# Patient Record
Sex: Female | Born: 2000 | Race: White | Hispanic: No | Marital: Single | Smoking: Never smoker
Health system: Southern US, Community
[De-identification: ages and names within clinical notes are randomized; demographics above are authoritative.]

## PROBLEM LIST (undated history)

## (undated) DIAGNOSIS — F419 Anxiety disorder, unspecified: Secondary | ICD-10-CM

## (undated) DIAGNOSIS — F32A Depression, unspecified: Secondary | ICD-10-CM

## (undated) DIAGNOSIS — F329 Major depressive disorder, single episode, unspecified: Secondary | ICD-10-CM

---
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
## 2020-02-28 ENCOUNTER — Encounter: Payer: Self-pay | Admitting: Nurse Practitioner

## 2020-02-28 ENCOUNTER — Other Ambulatory Visit: Payer: Self-pay

## 2020-02-28 ENCOUNTER — Ambulatory Visit (INDEPENDENT_AMBULATORY_CARE_PROVIDER_SITE_OTHER): Payer: Medicaid Other | Admitting: Nurse Practitioner

## 2020-02-28 VITALS — BP 110/68 | HR 77 | Temp 97.9°F | Resp 20 | Ht 62.0 in | Wt 213.0 lb

## 2020-02-28 DIAGNOSIS — R234 Changes in skin texture: Secondary | ICD-10-CM

## 2020-02-28 MED ORDER — CLOTRIMAZOLE-BETAMETHASONE 1-0.05 % EX CREA: 1.0000 "application " | TOPICAL_CREAM | Freq: Two times a day (BID) | CUTANEOUS | 0 refills | Status: DC

## 2020-02-28 NOTE — Progress Notes (Signed)
° °  Subjective:    Patient ID: Lindsay Valdez, female    DOB: 12-14-2000, 19 y.o.   MRN: [id number]   Chief Complaint: Discolored area to left breast (For 2 months)   HPI Patient come sin today c/o discoloration of area on left breast. Has been that way for several months. But now area feels raised and slightly sensative to touch. No nipple discharge.  denies family history of breast cancer.   Review of Systems  Constitutional: Negative for diaphoresis.  Eyes: Negative for pain.  Respiratory: Negative for shortness of breath.   Cardiovascular: Negative for chest pain, palpitations and leg swelling.  Gastrointestinal: Negative for abdominal pain.  Endocrine: Negative for polydipsia.  Skin: Negative for rash.  Neurological: Negative for dizziness, weakness and headaches.  Hematological: Does not bruise/bleed easily.  All other systems reviewed and are negative.      Objective:   Physical Exam Vitals and nursing note reviewed.  Constitutional:      Appearance: She is obese.  Cardiovascular:     Rate and Rhythm: Normal rate and regular rhythm.     Heart sounds: Normal heart sounds.  Pulmonary:     Breath sounds: Normal breath sounds.  Chest:     Breasts:        Right: Normal. No inverted nipple, mass or nipple discharge.        Left: Skin change present. No mass or nipple discharge.       Comments: White patchy slightly raised are on left breast Skin:    General: Skin is warm and dry.  Neurological:     General: No focal deficit present.     Mental Status: She is alert.  Psychiatric:        Mood and Affect: Mood normal.        Behavior: Behavior normal.    BP 110/68    Pulse 77    Temp 97.9 F (36.6 C) (Temporal)    Resp 20    Ht 5\' 2"  (1.575 m)    Wt 213 lb (96.6 kg)    SpO2 98%    BMI 38.96 kg/m        Assessment & Plan:  Lindsay Valdez in today with chief complaint of Discolored area to left breast (For 2 months)   1. Breast skin changes Meds ordered  this encounter  Medications   clotrimazole-betamethasone (LOTRISONE) cream    Sig: Apply 1 application topically 2 (two) times daily.    Dispense:  30 g    Refill:  0    Order Specific Question:   Supervising Provider    Answer:   A [[id number]]   If area does not improve will do U?S first If U/S is ok then will do derm referral Continue to watch area and report if no change    The above assessment and management plan was discussed with the patient. The patient verbalized understanding of and has agreed to the management plan. Patient is aware to call the clinic if symptoms persist or worsen. Patient is aware when to return to the clinic for a follow-up visit. Patient educated on when it is appropriate to go to the emergency department.   Mary-Margaret Arville Care, FNP

## 2020-06-27 DIAGNOSIS — Z20822 Contact with and (suspected) exposure to covid-19: Secondary | ICD-10-CM | POA: Diagnosis not present

## 2020-06-30 DIAGNOSIS — Z20822 Contact with and (suspected) exposure to covid-19: Secondary | ICD-10-CM | POA: Diagnosis not present

## 2020-09-07 ENCOUNTER — Other Ambulatory Visit: Payer: Self-pay

## 2020-09-07 ENCOUNTER — Encounter: Payer: Self-pay | Admitting: Family Medicine

## 2020-09-07 ENCOUNTER — Ambulatory Visit (INDEPENDENT_AMBULATORY_CARE_PROVIDER_SITE_OTHER): Payer: Medicaid Other | Admitting: Family Medicine

## 2020-09-07 VITALS — BP 109/74 | HR 78 | Temp 97.7°F | Wt 227.0 lb

## 2020-09-07 DIAGNOSIS — R234 Changes in skin texture: Secondary | ICD-10-CM

## 2020-09-07 NOTE — Patient Instructions (Signed)
Ultrasound ordered in anticipation that the dermatologist will want it.  I have placed referral to derm in .  Contact Toni Amend here at [employer] if you do not hear from either ultrasound or dermatology in 1 week.

## 2020-09-07 NOTE — Progress Notes (Signed)
   Subjective: CC: Skin lesions PCP: Raliegh Ip, DO KGU:RKYHCWC Lindsay Valdez is a 20 y.o. female presenting to clinic today for:  1.  Skin lesions Patient reports of regular history of depigmentation along the left breast.  However, she notes that these areas have been getting larger despite recent treatment with clotrimazole-betamethasone cream, which she used twice daily for 2 weeks.  She does describe the lesions as itchy but that it burns and is overly sensitive if she scratches it.  No nipple discharge, palpable lumps in the breast.  There is a family history of skin cancer, which is what she was afraid of.   ROS: Per HPI  No Known Allergies Past Medical History:  Diagnosis Date  . Anxiety   . Depression     Current Outpatient Medications:  .  clotrimazole-betamethasone (LOTRISONE) cream, Apply 1 application topically 2 (two) times daily., Disp: 30 g, Rfl: 0 Social History   Socioeconomic History  . Marital status: Single    Spouse name: Not on file  . Number of children: Not on file  . Years of education: Not on file  . Highest education level: Not on file  Occupational History  . Not on file  Tobacco Use  . Smoking status: Passive Smoke Exposure - Never Smoker  . Smokeless tobacco: Never Used  Vaping Use  . Vaping Use: Never used  Substance and Sexual Activity  . Alcohol use: No  . Drug use: No  . Sexual activity: Never  Other Topics Concern  . Not on file  Social History Narrative  . Not on file   Social Determinants of Health   Financial Resource Strain: Not on file  Food Insecurity: Not on file  Transportation Needs: Not on file  Physical Activity: Not on file  Stress: Not on file  Social Connections: Not on file  Intimate Partner Violence: Not on file   Family History  Problem Relation Age of Onset  . Hypertension Mother   . Hyperthyroidism Mother   . Diabetes Paternal Grandmother   . COPD Paternal Grandmother   . Hyperlipidemia Paternal  Grandmother   . Hypertension Paternal Grandmother   . Anxiety disorder Paternal Grandmother     Objective: Office vital signs reviewed. BP 109/74   Pulse 78   Temp 97.7 F (36.5 C)   Wt 227 lb (103 kg)   SpO2 98%   BMI 41.52 kg/m   Physical Examination:  General: Awake, alert, well nourished, No acute distress Breast: Irregularly shaped patches of mildly thickened skin with depigmentation.  No rolled borders, associated erythema.  No palpable breast lumps or nodules.  No skin breakdown otherwise.  Assessment/ Plan: 20 y.o. female   Breast skin changes - Plan: US BREAST COMPLETE UNI LEFT INC AXILLA, Ambulatory referral to Dermatology  Uncertain etiology of the skin changes.  At first I also believe this to be fungal in nature but she is status post treatment with clotrimazole-betamethasone cream and this is not helped at all.  Given longevity of symptoms I have recommended that she see a dermatologist for further evaluation.  We will also plan for breast ultrasound as well.  No orders of the defined types were placed in this encounter.  No orders of the defined types were placed in this encounter.    Raliegh Ip, DO [facility] [phone]

## 2020-09-10 ENCOUNTER — Telehealth: Payer: Self-pay | Admitting: Family Medicine

## 2020-09-18 ENCOUNTER — Ambulatory Visit (HOSPITAL_COMMUNITY)
Admission: RE | Admit: 2020-09-18 | Discharge: 2020-09-18 | Disposition: A | Discharge status: Home / Self Care | Payer: Medicaid Other | Source: Ambulatory Visit | Attending: Family Medicine | Admitting: Family Medicine

## 2020-09-18 ENCOUNTER — Other Ambulatory Visit: Payer: Self-pay

## 2020-09-18 DIAGNOSIS — R234 Changes in skin texture: Secondary | ICD-10-CM | POA: Insufficient documentation

## 2020-09-18 DIAGNOSIS — N6489 Other specified disorders of breast: Secondary | ICD-10-CM | POA: Diagnosis not present

## 2020-09-19 DIAGNOSIS — D485 Neoplasm of uncertain behavior of skin: Secondary | ICD-10-CM | POA: Diagnosis not present

## 2020-09-19 DIAGNOSIS — L940 Localized scleroderma [morphea]: Secondary | ICD-10-CM | POA: Diagnosis not present

## 2020-09-19 NOTE — Progress Notes (Signed)
Patient aware and verbalized understanding. °

## 2020-10-01 DIAGNOSIS — L900 Lichen sclerosus et atrophicus: Secondary | ICD-10-CM | POA: Diagnosis not present

## 2020-10-05 ENCOUNTER — Ambulatory Visit: Payer: Medicaid Other | Admitting: Nurse Practitioner

## 2020-10-09 ENCOUNTER — Encounter: Payer: Self-pay | Admitting: Family Medicine

## 2021-03-15 ENCOUNTER — Encounter: Payer: Self-pay | Admitting: Family Medicine

## 2021-03-15 ENCOUNTER — Telehealth (INDEPENDENT_AMBULATORY_CARE_PROVIDER_SITE_OTHER): Payer: Medicaid Other | Admitting: Family Medicine

## 2021-03-15 DIAGNOSIS — R519 Headache, unspecified: Secondary | ICD-10-CM

## 2021-03-15 DIAGNOSIS — Z20822 Contact with and (suspected) exposure to covid-19: Secondary | ICD-10-CM

## 2021-03-15 DIAGNOSIS — R059 Cough, unspecified: Secondary | ICD-10-CM

## 2021-03-15 NOTE — Progress Notes (Signed)
Virtual Visit via MyChart Video Note Due to COVID-19 pandemic this visit was conducted virtually. This visit type was conducted due to national recommendations for restrictions regarding the COVID-19 Pandemic (e.g. social distancing, sheltering in place) in an effort to limit this patient's exposure and mitigate transmission in our community. All issues noted in this document were discussed and addressed.  A physical exam was not performed with this format.   I connected with Lindsay Valdez on 03/15/2021 at 0800 by MyChart Video and verified that I am speaking with the correct person using two identifiers. Lindsay Valdez is currently located at home and family is currently with them during visit. The provider, Kari Baars, FNP is located in their office at time of visit.  I discussed the limitations, risks, security and privacy concerns of performing an evaluation and management service by telephone and the availability of in person appointments. I also discussed with the patient that there may be a patient responsible charge related to this service. The patient expressed understanding and agreed to proceed.  Subjective:  Patient ID: Lindsay Valdez, female    DOB: 08-Mar-2001, 20 y.o.   MRN: [id number]  Chief Complaint:  Covid Exposure   HPI: Lindsay Valdez is a 20 y.o. female presenting on 03/15/2021 for Covid Exposure   Pt states she was exposed to several coworkers who are positive for COVID-19. States she started having a headache, sore throat, congestion, and nausea 2 days ago. Has had negative home test but employer would like her to have a send off test to confirm. She has been taking tylenol with relief of symptoms.     Relevant past medical, surgical, family, and social history reviewed and updated as indicated.  Allergies and medications reviewed and updated.   Past Medical History:  Diagnosis Date   Anxiety    Depression     History reviewed. No pertinent surgical  history.  Social History   Socioeconomic History   Marital status: Single    Spouse name: Not on file   Number of children: Not on file   Years of education: Not on file   Highest education level: Not on file  Occupational History   Not on file  Tobacco Use   Smoking status: Passive Smoke Exposure - Never Smoker   Smokeless tobacco: Never  Vaping Use   Vaping Use: Never used  Substance and Sexual Activity   Alcohol use: No   Drug use: No   Sexual activity: Never  Other Topics Concern   Not on file  Social History Narrative   Not on file   Social Determinants of Health   Financial Resource Strain: Not on file  Food Insecurity: Not on file  Transportation Needs: Not on file  Physical Activity: Not on file  Stress: Not on file  Social Connections: Not on file  Intimate Partner Violence: Not on file    Outpatient Encounter Medications as of 03/15/2021  Medication Sig   clotrimazole-betamethasone (LOTRISONE) cream Apply 1 application topically 2 (two) times daily.   No facility-administered encounter medications on file as of 03/15/2021.    No Known Allergies  Review of Systems  Constitutional:  Positive for fatigue. Negative for activity change, appetite change, chills, diaphoresis, fever and unexpected weight change.  HENT:  Positive for congestion and sore throat.   Eyes: Negative.   Respiratory:  Negative for cough, choking, chest tightness, shortness of breath, wheezing and stridor.   Cardiovascular:  Negative for chest pain, palpitations and leg swelling.  Gastrointestinal:  Positive for nausea. Negative for abdominal distention, abdominal pain, anal bleeding, blood in stool, constipation, diarrhea, rectal pain and vomiting.  Endocrine: Negative.   Genitourinary:  Negative for decreased urine volume, difficulty urinating, dysuria, frequency and urgency.  Musculoskeletal:  Negative for arthralgias and myalgias.  Skin: Negative.   Allergic/Immunologic: Negative.    Neurological:  Positive for headaches. Negative for dizziness, tremors, seizures, syncope, facial asymmetry, speech difficulty, weakness, light-headedness and numbness.  Hematological: Negative.   Psychiatric/Behavioral:  Negative for confusion, hallucinations, sleep disturbance and suicidal ideas.   All other systems reviewed and are negative.       Observations/Objective: No vital signs or physical exam, this was a telephone or virtual health encounter.  Pt alert and oriented, answers all questions appropriately, and able to speak in full sentences.    Assessment and Plan: Lindsay Valdez was seen today for covid exposure.  Diagnoses and all orders for this visit:  Close exposure to COVID-19 virus Cough Aching headache Recent exposure to COVID-19. Symptoms for 2 days. Negative home test, will obtain send off testing. Symptomatic care discussed in detail. Report any new, worsening, or persistent symptoms.  -     Novel Coronavirus, NAA ([facility])    Follow Up Instructions: Return if symptoms worsen or fail to improve.    I discussed the assessment and treatment plan with the patient. The patient was provided an opportunity to ask questions and all were answered. The patient agreed with the plan and demonstrated an understanding of the instructions.   The patient was advised to call back or seek an in-person evaluation if the symptoms worsen or if the condition fails to improve as anticipated.  The above assessment and management plan was discussed with the patient. The patient verbalized understanding of and has agreed to the management plan. Patient is aware to call the clinic if they develop any new symptoms or if symptoms persist or worsen. Patient is aware when to return to the clinic for a follow-up visit. Patient educated on when it is appropriate to go to the emergency department.    I provided 15 minutes of MyChart -face-to-face time during this encounter. The video started at  0800. The video ended at [id number]. The other time was used for coordination of care.    Kari Baars, FNP-C [facility] [address] [place], [place] [address] [phone] 03/15/2021

## 2021-03-16 LAB — NOVEL CORONAVIRUS, NAA: SARS-CoV-2, NAA: NOT DETECTED

## 2021-03-16 LAB — SARS-COV-2, NAA 2 DAY TAT

## 2021-03-18 DIAGNOSIS — H5213 Myopia, bilateral: Secondary | ICD-10-CM | POA: Diagnosis not present

## 2021-04-30 ENCOUNTER — Encounter: Payer: Self-pay | Admitting: Nurse Practitioner

## 2021-04-30 ENCOUNTER — Ambulatory Visit (HOSPITAL_COMMUNITY)
Admission: RE | Admit: 2021-04-30 | Discharge: 2021-04-30 | Disposition: A | Discharge status: Home / Self Care | Payer: Medicaid Other | Source: Ambulatory Visit | Attending: Nurse Practitioner | Admitting: Nurse Practitioner

## 2021-04-30 ENCOUNTER — Ambulatory Visit (INDEPENDENT_AMBULATORY_CARE_PROVIDER_SITE_OTHER): Payer: Medicaid Other | Admitting: Nurse Practitioner

## 2021-04-30 ENCOUNTER — Other Ambulatory Visit: Payer: Self-pay

## 2021-04-30 VITALS — BP 124/73 | HR 97 | Temp 98.1°F | Ht 62.0 in | Wt 235.0 lb

## 2021-04-30 DIAGNOSIS — S0990XA Unspecified injury of head, initial encounter: Secondary | ICD-10-CM | POA: Diagnosis not present

## 2021-04-30 DIAGNOSIS — R42 Dizziness and giddiness: Secondary | ICD-10-CM | POA: Diagnosis not present

## 2021-04-30 NOTE — Assessment & Plan Note (Addendum)
Patient was hit by a crow bar at work this morning and has since experienced Nausea, and vomited once,  dizziness, headache, and all over tiredness. No changes to visual fields. Patient is reporting a pain of 7/10   Start CT ordered to rule out any traumatic injury to the brain. Advised patient to use tylenol for pain and rest   Education provided to patient with printed hand out given

## 2021-04-30 NOTE — Progress Notes (Signed)
Acute Office Visit  Subjective:    Patient ID: Lindsay Valdez, female    DOB: [birth year], 20 y.o.   MRN: [id number]  Chief Complaint  Patient presents with   Head Injury    Head Injury  The incident occurred 1 to 3 hours ago. Injury mechanism: work related injury. There was no loss of consciousness. There was no blood loss. The quality of the pain is described as aching and pulsating. The pain is at a severity of 7/10. The pain is severe. The pain has been constant since the injury. Associated symptoms include headaches, vomiting and weakness. Pertinent negatives include no blurred vision, disorientation or memory loss. She has tried nothing for the symptoms.    Past Medical History:  Diagnosis Date   Anxiety    Depression     No past surgical history on file.  Family History  Problem Relation Age of Onset   Hypertension Mother    Hyperthyroidism Mother    Diabetes Paternal Grandmother    COPD Paternal Grandmother    Hyperlipidemia Paternal Grandmother    Hypertension Paternal Grandmother    Anxiety disorder Paternal Grandmother     Social History   Socioeconomic History   Marital status: Single    Spouse name: Not on file   Number of children: Not on file   Years of education: Not on file   Highest education level: Not on file  Occupational History   Not on file  Tobacco Use   Smoking status: Passive Smoke Exposure - Never Smoker   Smokeless tobacco: Never  Vaping Use   Vaping Use: Never used  Substance and Sexual Activity   Alcohol use: No   Drug use: No   Sexual activity: Never  Other Topics Concern   Not on file  Social History Narrative   Not on file   Social Determinants of Health   Financial Resource Strain: Not on file  Food Insecurity: Not on file  Transportation Needs: Not on file  Physical Activity: Not on file  Stress: Not on file  Social Connections: Not on file  Intimate Partner Violence: Not on file    Outpatient Medications  Prior to Visit  Medication Sig Dispense Refill   clotrimazole-betamethasone (LOTRISONE) cream Apply 1 application topically 2 (two) times daily. 30 g 0   No facility-administered medications prior to visit.    No Known Allergies  Review of Systems  Constitutional: Negative.   HENT: Negative.    Eyes: Negative.  Negative for blurred vision.  Respiratory: Negative.    Gastrointestinal:  Positive for vomiting.  Musculoskeletal: Negative.   Skin:  Negative for rash.  Neurological:  Positive for dizziness, weakness and headaches.  Psychiatric/Behavioral:  Negative for memory loss.   All other systems reviewed and are negative.     Objective:    Physical Exam Vitals and nursing note reviewed.  Constitutional:      Appearance: Normal appearance.  HENT:     Head: Normocephalic.     Right Ear: External ear normal.     Nose: Nose normal.  Eyes:     Conjunctiva/sclera: Conjunctivae normal.  Cardiovascular:     Rate and Rhythm: Normal rate and regular rhythm.     Pulses: Normal pulses.  Pulmonary:     Effort: Pulmonary effort is normal.     Breath sounds: Normal breath sounds.  Abdominal:     General: Bowel sounds are normal.  Musculoskeletal:        General: Normal range of  motion.  Skin:    General: Skin is warm.     Findings: No rash.  Neurological:     Mental Status: She is alert and oriented to person, place, and time.     Motor: Motor function is intact.     Coordination: Coordination is intact.     Gait: Gait is intact.  Psychiatric:        Behavior: Behavior normal.    BP 124/73   Pulse 97   Temp 98.1 F (36.7 C) (Temporal)   Ht 5\' 2"  (1.575 m)   Wt 235 lb (106.6 kg)   BMI 42.98 kg/m  Wt Readings from Last 3 Encounters:  04/30/21 235 lb (106.6 kg) (>99 %, Z= 2.38)*  09/07/20 227 lb (103 kg) (99 %, Z= 2.28)*  02/28/20 213 lb (96.6 kg) (98 %, Z= 2.12)*   * Growth percentiles are based on CDC (Girls, 2-20 Years) data.    Health Maintenance Due  Topic  Date Due   HPV VACCINES (1 - 2-dose series) Never done   HIV Screening  Never done   Hepatitis C Screening  Never done   COVID-19 Vaccine (3 - Booster for Moderna series) 08/27/2020   INFLUENZA VACCINE  02/18/2021       Topic Date Due   HPV VACCINES (1 - 2-dose series) Never done     Lab Results  Component Value Date   TSH 2.630 07/30/2018   Lab Results  Component Value Date   WBC 9.0 07/30/2018   HGB 13.2 07/30/2018   HCT 39.7 07/30/2018   MCV 87 07/30/2018   PLT 269 07/30/2018   Lab Results  Component Value Date   NA 140 07/30/2018   K 4.0 07/30/2018   CO2 19 (L) 07/30/2018   GLUCOSE 89 07/30/2018   BUN 11 07/30/2018   CREATININE 0.72 07/30/2018   BILITOT 0.3 07/30/2018   ALKPHOS 78 07/30/2018   AST 16 07/30/2018   ALT 15 07/30/2018   PROT 7.0 07/30/2018   ALBUMIN 4.9 07/30/2018   CALCIUM 10.0 07/30/2018   ANIONGAP 7 11/15/2014   Lab Results  Component Value Date   CHOL 157 11/15/2014   Lab Results  Component Value Date   HDL 48 11/15/2014   Lab Results  Component Value Date   LDLCALC 93 11/15/2014   Lab Results  Component Value Date   TRIG 78 11/15/2014   Lab Results  Component Value Date   CHOLHDL 3.3 11/15/2014   Lab Results  Component Value Date   HGBA1C 5.5 07/30/2018       Assessment & Plan:   Problem List Items Addressed This Visit       Other   Head trauma - Primary    Patient was hit by a crow bar at work this morning and has since experienced Nausea, and vomited once,  dizziness, headache, and all over tiredness. No changes to visual fields. Patient is reporting a pain of 7/10   Start CT ordered to rule out any traumatic injury to the brain. Advised patient to use tylenol for pain and rest   Education provided to patient with printed hand out given         Relevant Orders   CT HEAD WO CONTRAST (09/28/2018)     No orders of the defined types were placed in this encounter.    , NP

## 2021-04-30 NOTE — Patient Instructions (Signed)
Head Injury, Adult ?There are many types of head injuries. They can be as minor as a small bump. Some head injuries can be worse. Worse injuries include: ?A strong hit to the head that shakes the brain back and forth, causing damage (concussion). ?A bruise (contusion) of the brain. This means there is bleeding in the brain that can cause swelling. ?A cracked skull (skull fracture). ?Bleeding in the brain that gathers, gets thick (makes a clot), and forms a bump (hematoma). ?Most problems from a head injury come in the first 24 hours. However, you may still have side effects up to 7-10 days after your injury. It is important to watch your condition for any changes. You may need to be watched in the emergency department or urgent care, or you may need to stay in the hospital. ?What are the causes? ?There are many possible causes of a head injury. A serious head injury may be caused by: ?A car accident. ?Bicycle or motorcycle accidents. ?Sports injuries. ?Falls. ?Being hit by an object. ?What are the signs or symptoms? ?Symptoms of a head injury include a bruise, bump, or bleeding where the injury happened. Other physical symptoms may include: ?Headache. ?Feeling like you may vomit (nauseous) or vomiting. ?Dizziness. ?Blurred or double vision. ?Being uncomfortable around bright lights or loud noises. ?Shaking movements that you cannot control (seizures). ?Feeling tired. ?Trouble being woken up. ?Fainting or loss of consciousness. ?Mental or emotional symptoms may include: ?Feeling grumpy or cranky. ?Confusion and memory problems. ?Having trouble paying attention or concentrating. ?Changes in eating or sleeping habits. ?Feeling worried or nervous (anxious). ?Feeling sad (depressed). ?How is this treated? ?Treatment for this condition depends on how severe the injury is and the type of injury you have. The main goal is to prevent problems and to allow the brain time to heal. ?Mild head injury ?If you have a mild head  injury, you may be sent home, and treatment may include: ?Being watched. A responsible adult should stay with you for 24 hours after your injury and check on you often. ?Physical rest. ?Brain rest. ?Pain medicines. ?Severe head injury ?If you have a severe head injury, treatment may include: ?Being watched closely. This includes staying in the hospital. ?Medicines to: ?Help with pain. ?Prevent seizures. ?Help with brain swelling. ?Protecting your airway and using a machine that helps you breathe (ventilator). ?Treatments to watch for and manage swelling inside the brain. ?Brain surgery. This may be needed to: ?Remove a collection of blood or blood clots. ?Stop the bleeding. ?Remove a part of the skull. This allows room for the brain to swell. ?Follow these instructions at home: ?Activity ?Rest. ?Avoid activities that are hard or tiring. ?Make sure you get enough sleep. ?Let your brain rest. Do this by limiting activities that need a lot of thought or attention, such as: ?Watching TV. ?Playing memory games and puzzles. ?Job-related work or homework. ?Working on the computer, social media, and texting. ?Avoid activities that could cause another head injury until your doctor says it is okay. This includes playing sports. Having another head injury, especially before the first one has healed, can be dangerous. ?Ask your doctor when it is safe for you to go back to your normal activities, such as work or school. Ask your doctor for a step-by-step plan for slowly going back to your normal activities. ?Ask your doctor when you can drive, ride a bicycle, or use heavy machinery. Do not do these activities if you are dizzy. ?Lifestyle ? ?Do   not drink alcohol until your doctor says it is okay. ?Do not use drugs. ?If it is harder than usual to remember things, write them down. ?If you are easily distracted, try to do one thing at a time. ?Talk with family members or close friends when making important decisions. ?Tell your  friends, family, a trusted co-worker, and work manager about your injury, symptoms, and limits (restrictions). Have them watch for any problems that are new or getting worse. ?General instructions ?Take over-the-counter and prescription medicines only as told by your doctor. ?Have someone stay with you for 24 hours after your head injury. This person should watch you for any changes in your symptoms and be ready to get help. ?Keep all follow-up visits as told by your doctor. This is important. ?How is this prevented? ?Work on your balance and strength. This can help you avoid falls. ?Wear a seat belt when you are in a moving vehicle. ?Wear a helmet when you: ?Ride a bicycle. ?Ski. ?Do any other sport or activity that has a risk of injury. ?If you drink alcohol: ?Limit how much you use to: ?0-1 drink a day for nonpregnant women. ?0-2 drinks a day for men. ?Be aware of how much alcohol is in your drink. In the [place] one drink equals one 12 oz bottle of beer (355 mL), one 5 oz glass of wine (148 mL), or one 1? oz glass of hard liquor (44 mL). ?Make your home safer by: ?Getting rid of clutter from the floors and stairs. This includes things that can make you trip. ?Using grab bars in bathrooms and handrails by stairs. ?Placing non-slip mats on floors and in bathtubs. ?Putting more light in dim areas. ?Where to find more information ?Centers for Disease Control and Prevention: www.cdc.gov ?Get help right away if: ?You have: ?A very bad headache that is not helped by medicine. ?Trouble walking or weakness in your arms and legs. ?Clear or bloody fluid coming from your nose or ears. ?Changes in how you see (vision). ?A seizure. ?More confusion or more grumpy moods. ?Your symptoms get worse. ?You are sleepier than normal and have trouble staying awake. ?You lose your balance. ?The black centers of your eyes (pupils) change in size. ?Your speech is slurred. ?Your dizziness gets worse. ?You vomit. ?These symptoms may be an  emergency. Do not wait to see if the symptoms will go away. Get medical help right away. Call your local emergency services (911 in the [place] Do not drive yourself to the hospital. ?Summary ?Head injuries can be as minor as a small bump. Some head injuries can be worse. ?Treatment for this condition depends on how severe the injury is and the type of injury you have. ?Have someone stay with you for 24 hours after your head injury. ?Ask your doctor when it is safe for you to go back to your normal activities, such as work or school. ?To prevent a head injury, wear a seat belt in a car, wear a helmet when you use a bicycle, limit your alcohol use, and make your home safer. ?This information is not intended to replace advice given to you by your health care provider. Make sure you discuss any questions you have with your health care provider. ?Document Revised: 05/20/2019 Document Reviewed: 05/20/2019 ?Elsevier Patient Education ? 2022 Elsevier Inc. ? ?

## 2021-05-31 ENCOUNTER — Ambulatory Visit (INDEPENDENT_AMBULATORY_CARE_PROVIDER_SITE_OTHER): Payer: Medicaid Other | Admitting: Family Medicine

## 2021-05-31 ENCOUNTER — Encounter: Payer: Self-pay | Admitting: Family Medicine

## 2021-05-31 DIAGNOSIS — R110 Nausea: Secondary | ICD-10-CM

## 2021-05-31 DIAGNOSIS — J069 Acute upper respiratory infection, unspecified: Secondary | ICD-10-CM

## 2021-05-31 LAB — VERITOR FLU A/B WAIVED
Influenza A: NEGATIVE
Influenza B: NEGATIVE

## 2021-05-31 MED ORDER — ONDANSETRON HCL 4 MG PO TABS: 4.0000 mg | ORAL_TABLET | Freq: Three times a day (TID) | ORAL | 0 refills | Status: DC | PRN

## 2021-05-31 NOTE — Progress Notes (Signed)
Virtual Visit via telephone Note Due to COVID-19 pandemic this visit was conducted virtually. This visit type was conducted due to national recommendations for restrictions regarding the COVID-19 Pandemic (e.g. social distancing, sheltering in place) in an effort to limit this patient's exposure and mitigate transmission in our community. All issues noted in this document were discussed and addressed.  A physical exam was not performed with this format.   I connected with Lindsay Valdez on 05/31/2021 at 0815 by telephone and verified that I am speaking with the correct person using two identifiers. Lindsay Valdez is currently located at home and patient is currently with them during visit. The provider, Kari Baars, FNP is located in their office at time of visit.  I discussed the limitations, risks, security and privacy concerns of performing an evaluation and management service by telephone and the availability of in person appointments. I also discussed with the patient that there may be a patient responsible charge related to this service. The patient expressed understanding and agreed to proceed.  Subjective:  Patient ID: Lindsay Valdez, female    DOB: Dec 07, 2000, 20 y.o.   MRN: [id number]  Chief Complaint:  URI   HPI: Lindsay Valdez is a 20 y.o. female presenting on 05/31/2021 for URI   Pt reports cough, congestion, myalgias, and nausea. States she developed nausea at work and they sent her home wanting her to be tested for COVID. Pt states she has been exposed to people with influenza. She denies fever but does report chills and myalgias.     Relevant past medical, surgical, family, and social history reviewed and updated as indicated.  Allergies and medications reviewed and updated.   Past Medical History:  Diagnosis Date   Anxiety    Depression     History reviewed. No pertinent surgical history.  Social History   Socioeconomic History   Marital status: Single     Spouse name: Not on file   Number of children: Not on file   Years of education: Not on file   Highest education level: Not on file  Occupational History   Not on file  Tobacco Use   Smoking status: Never    Passive exposure: Yes   Smokeless tobacco: Never  Vaping Use   Vaping Use: Never used  Substance and Sexual Activity   Alcohol use: No   Drug use: No   Sexual activity: Never  Other Topics Concern   Not on file  Social History Narrative   Not on file   Social Determinants of Health   Financial Resource Strain: Not on file  Food Insecurity: Not on file  Transportation Needs: Not on file  Physical Activity: Not on file  Stress: Not on file  Social Connections: Not on file  Intimate Partner Violence: Not on file    Outpatient Encounter Medications as of 05/31/2021  Medication Sig   ondansetron (ZOFRAN) 4 MG tablet Take 1 tablet (4 mg total) by mouth every 8 (eight) hours as needed for nausea or vomiting.   clotrimazole-betamethasone (LOTRISONE) cream Apply 1 application topically 2 (two) times daily.   No facility-administered encounter medications on file as of 05/31/2021.    No Known Allergies  Review of Systems  Constitutional:  Positive for activity change, appetite change and chills. Negative for diaphoresis, fatigue, fever and unexpected weight change.  HENT:  Positive for congestion, postnasal drip and rhinorrhea. Negative for dental problem, ear discharge, ear pain, facial swelling, hearing loss, mouth sores, nosebleeds, sinus pressure, sinus pain,  sneezing, sore throat, tinnitus, trouble swallowing and voice change.   Eyes: Negative.   Respiratory:  Positive for cough. Negative for chest tightness and shortness of breath.   Cardiovascular:  Negative for chest pain, palpitations and leg swelling.  Gastrointestinal:  Positive for nausea and vomiting (x 1). Negative for abdominal pain, anal bleeding, blood in stool, constipation, diarrhea and rectal pain.   Endocrine: Negative.   Genitourinary:  Negative for decreased urine volume, difficulty urinating, dysuria, frequency and urgency.  Musculoskeletal:  Positive for myalgias. Negative for arthralgias.  Skin: Negative.   Allergic/Immunologic: Negative.   Neurological:  Negative for dizziness, weakness and headaches.  Hematological: Negative.   Psychiatric/Behavioral:  Negative for confusion, hallucinations, sleep disturbance and suicidal ideas.   All other systems reviewed and are negative.       Observations/Objective: No vital signs or physical exam, this was a telephone or virtual health encounter.  Pt alert and oriented, answers all questions appropriately, and able to speak in full sentences.    Assessment and Plan: Lindsay Valdez was seen today for uri.  Diagnoses and all orders for this visit:  URI with cough and congestion Will test for COVID and influenza. Symptomatic care discussed in detail. Further treatment pending results.  -     Veritor Flu A/B Waived -     Novel Coronavirus, NAA ([facility])  Nausea in adult Nausea as needed for nausea. Bland diet, advance as tolerated. Adequate hydration discussed in detail. Report any new or worsening symptoms.  -     ondansetron (ZOFRAN) 4 MG tablet; Take 1 tablet (4 mg total) by mouth every 8 (eight) hours as needed for nausea or vomiting. -     Veritor Flu A/B Waived    Follow Up Instructions: Return if symptoms worsen or fail to improve.    I discussed the assessment and treatment plan with the patient. The patient was provided an opportunity to ask questions and all were answered. The patient agreed with the plan and demonstrated an understanding of the instructions.   The patient was advised to call back or seek an in-person evaluation if the symptoms worsen or if the condition fails to improve as anticipated.  The above assessment and management plan was discussed with the patient. The patient verbalized understanding of and  has agreed to the management plan. Patient is aware to call the clinic if they develop any new symptoms or if symptoms persist or worsen. Patient is aware when to return to the clinic for a follow-up visit. Patient educated on when it is appropriate to go to the emergency department.    I provided 12 minutes of non-face-to-face time during this encounter. The call started at 0815. The call ended at 0825. The other time was used for coordination of care.    Kari Baars, FNP-C [facility] [address] [place], [place] [address] [phone] 05/31/2021

## 2021-06-01 LAB — NOVEL CORONAVIRUS, NAA: SARS-CoV-2, NAA: NOT DETECTED

## 2021-06-01 LAB — SARS-COV-2, NAA 2 DAY TAT

## 2022-02-07 ENCOUNTER — Ambulatory Visit: Payer: Medicaid Other

## 2022-02-11 ENCOUNTER — Ambulatory Visit (INDEPENDENT_AMBULATORY_CARE_PROVIDER_SITE_OTHER): Payer: Medicaid Other | Admitting: Nurse Practitioner

## 2022-02-11 ENCOUNTER — Ambulatory Visit (INDEPENDENT_AMBULATORY_CARE_PROVIDER_SITE_OTHER): Payer: Medicaid Other

## 2022-02-11 ENCOUNTER — Encounter: Payer: Self-pay | Admitting: Nurse Practitioner

## 2022-02-11 VITALS — BP 132/79 | HR 101 | Temp 98.6°F | Ht 62.0 in | Wt 230.0 lb

## 2022-02-11 DIAGNOSIS — M25552 Pain in left hip: Secondary | ICD-10-CM | POA: Diagnosis not present

## 2022-02-11 MED ORDER — METHOCARBAMOL 500 MG PO TABS: 500.0000 mg | ORAL_TABLET | Freq: Four times a day (QID) | ORAL | 0 refills | Status: DC

## 2022-02-11 MED ORDER — IBUPROFEN 600 MG PO TABS: 600.0000 mg | ORAL_TABLET | Freq: Three times a day (TID) | ORAL | 0 refills | Status: DC | PRN

## 2022-02-11 NOTE — Patient Instructions (Signed)
Hip Pain The hip is the joint between the upper legs and the lower pelvis. The bones, cartilage, tendons, and muscles of your hip joint support your body and allow you to move around. Hip pain can range from a minor ache to severe pain in one or both of your hips. The pain may be felt on the inside of the hip joint near the groin, or on the outside near the buttocks and upper thigh. You may also have swelling or stiffness in your hip area. Follow these instructions at home: Managing pain, stiffness, and swelling     If directed, put ice on the painful area. To do this: Put ice in a plastic bag. Place a towel between your skin and the bag. Leave the ice on for 20 minutes, 2-3 times a day. If directed, apply heat to the affected area as often as told by your health care provider. Use the heat source that your health care provider recommends, such as a moist heat pack or a heating pad. Place a towel between your skin and the heat source. Leave the heat on for 20-30 minutes. Remove the heat if your skin turns bright red. This is especially important if you are unable to feel pain, heat, or cold. You may have a greater risk of getting burned. Activity Do exercises as told by your health care provider. Avoid activities that cause pain. General instructions  Take over-the-counter and prescription medicines only as told by your health care provider. Keep a journal of your symptoms. Write down: How often you have hip pain. The location of your pain. What the pain feels like. What makes the pain worse. Sleep with a pillow between your legs on your most comfortable side. Keep all follow-up visits as told by your health care provider. This is important. Contact a health care provider if: You cannot put weight on your leg. Your pain or swelling continues or gets worse after one week. It gets harder to walk. You have a fever. Get help right away if: You fall. You have a sudden increase in pain  and swelling in your hip. Your hip is red or swollen or very tender to touch. Summary Hip pain can range from a minor ache to severe pain in one or both of your hips. The pain may be felt on the inside of the hip joint near the groin, or on the outside near the buttocks and upper thigh. Avoid activities that cause pain. Write down how often you have hip pain, the location of the pain, what makes it worse, and what it feels like. This information is not intended to replace advice given to you by your health care provider. Make sure you discuss any questions you have with your health care provider. Document Revised: 11/22/2018 Document Reviewed: 11/22/2018 Elsevier Patient Education  2023 Elsevier Inc.  

## 2022-02-11 NOTE — Progress Notes (Signed)
Acute Office Visit  Subjective:     Patient ID: Lindsay Valdez, female    DOB: 19-Mar-2001, 20 y.o.   MRN: [id number]  Chief Complaint  Patient presents with   Hip Pain    Been going on for about 5 days now , no injury that she know of     Hip Pain  The incident occurred 2 days ago. The incident occurred at home. There was no injury mechanism. The quality of the pain is described as aching. The pain is at a severity of 5/10. The pain is moderate. The pain has been Constant since onset. Associated symptoms include numbness. Pertinent negatives include no loss of motion, loss of sensation or tingling. She reports no foreign bodies present. The symptoms are aggravated by movement and palpation. She has tried nothing for the symptoms.    Review of Systems  Constitutional: Negative.   HENT: Negative.    Respiratory: Negative.    Cardiovascular: Negative.   Genitourinary: Negative.   Musculoskeletal:  Positive for back pain.  Skin: Negative.  Negative for itching and rash.  Neurological:  Positive for numbness. Negative for tingling.  All other systems reviewed and are negative.       Objective:    BP 132/79   Pulse (!) 101   Temp 98.6 F (37 C)   Ht 5\' 2"  (1.575 m)   Wt 230 lb (104.3 kg)   LMP 02/09/2022 (Exact Date) Comment: end date  SpO2 94%   BMI 42.07 kg/m  BP Readings from Last 3 Encounters:  02/11/22 132/79  04/30/21 124/73  09/07/20 109/74   Wt Readings from Last 3 Encounters:  02/11/22 230 lb (104.3 kg)  04/30/21 235 lb (106.6 kg) (>99 %, Z= 2.38)*  09/07/20 227 lb (103 kg) (99 %, Z= 2.28)*   * Growth percentiles are based on CDC (Girls, 2-20 Years) data.      Physical Exam Vitals and nursing note reviewed.  Constitutional:      Appearance: Normal appearance.  HENT:     Head: Normocephalic.     Right Ear: External ear normal.     Left Ear: External ear normal.     Nose: Nose normal.     Mouth/Throat:     Mouth: Mucous membranes are moist.      Pharynx: Oropharynx is clear.  Eyes:     Conjunctiva/sclera: Conjunctivae normal.  Cardiovascular:     Rate and Rhythm: Normal rate and regular rhythm.     Pulses: Normal pulses.     Heart sounds: Normal heart sounds.  Pulmonary:     Effort: Pulmonary effort is normal.     Breath sounds: Normal breath sounds.  Abdominal:     General: Bowel sounds are normal.  Musculoskeletal:     Right hip: Tenderness present. Decreased range of motion.  Skin:    General: Skin is warm.  Neurological:     General: No focal deficit present.     Mental Status: She is alert and oriented to person, place, and time.  Psychiatric:        Behavior: Behavior normal.     No results found for any visits on 02/11/22.      Assessment & Plan:  Patient presents with left hip pain, no precipitating incidents.  Symptoms present in the past 2 to 3 days.  With numbness and pressure.  Completed left hip x-ray results pending.  Ice pack/heat pad as tolerated.  Wrist joint, ibuprofen or Tylenol as tolerated.  Robaxin  for musculoskeletal pain.  Follow-up with worsening unresolved symptoms. Problem List Items Addressed This Visit   None Visit Diagnoses     Pain of left hip    -  Primary   Relevant Medications   ibuprofen (ADVIL) 600 MG tablet   methocarbamol (ROBAXIN) 500 MG tablet   Other Relevant Orders   DG Hip Unilat W OR W/O Pelvis 2-3 Views Left       Meds ordered this encounter  Medications   ibuprofen (ADVIL) 600 MG tablet    Sig: Take 1 tablet (600 mg total) by mouth every 8 (eight) hours as needed.    Dispense:  30 tablet    Refill:  0    Order Specific Question:   Supervising Provider    Answer:   Mechele Claude [[id number]]   methocarbamol (ROBAXIN) 500 MG tablet    Sig: Take 1 tablet (500 mg total) by mouth 4 (four) times daily.    Dispense:  30 tablet    Refill:  0    Order Specific Question:   Supervising Provider    Answer:   Mechele Claude [phone]    Return if symptoms worsen or fail  to improve.  Daryll Drown, NP

## 2022-02-24 ENCOUNTER — Telehealth: Payer: Self-pay | Admitting: Family Medicine

## 2022-02-24 ENCOUNTER — Other Ambulatory Visit: Payer: Self-pay | Admitting: Nurse Practitioner

## 2022-02-24 DIAGNOSIS — G8929 Other chronic pain: Secondary | ICD-10-CM

## 2022-02-24 NOTE — Telephone Encounter (Signed)
I have completed referral to orthopedic. Pleas have patient follow up with PCP

## 2022-02-24 NOTE — Telephone Encounter (Signed)
Pt called stating that she had a visit with Onyeje on 7/25 and was prescribed muscle relaxers and ibuprofen to take for sciatic nerve pain. Pt says the Rx's are not helping and needs advise on what to do.

## 2022-02-25 NOTE — Telephone Encounter (Signed)
PT AWARE OF RECOMMENDATION

## 2022-02-28 ENCOUNTER — Telehealth: Payer: Self-pay | Admitting: Radiology

## 2022-02-28 ENCOUNTER — Ambulatory Visit (INDEPENDENT_AMBULATORY_CARE_PROVIDER_SITE_OTHER): Payer: Medicaid Other | Admitting: Surgery

## 2022-02-28 ENCOUNTER — Encounter: Payer: Self-pay | Admitting: Surgery

## 2022-02-28 DIAGNOSIS — M461 Sacroiliitis, not elsewhere classified: Secondary | ICD-10-CM

## 2022-02-28 DIAGNOSIS — M7062 Trochanteric bursitis, left hip: Secondary | ICD-10-CM

## 2022-02-28 MED ORDER — MELOXICAM 15 MG PO TABS: 15.0000 mg | ORAL_TABLET | Freq: Every day | ORAL | 0 refills | Status: DC

## 2022-02-28 MED ORDER — DICLOFENAC SODIUM 75 MG PO TBEC: 75.0000 mg | DELAYED_RELEASE_TABLET | Freq: Two times a day (BID) | ORAL | 0 refills | Status: DC | PRN

## 2022-02-28 NOTE — Progress Notes (Signed)
Office Visit Note   Patient: Lindsay Valdez           Date of Birth: Apr 07, 2001           MRN: [id number] Visit Date: 02/28/2022              Requested by: Ivy Lynn, NP [address] [place],  [place] [address] PCP: [name], DO   Assessment & Plan: Visit Diagnoses:  1. Greater trochanteric bursitis of left hip   2. Bilateral SI (sacroiliac) joint inflammation (HCC)     Plan: With patient's current symptoms have blood work drawn today to check a CBC and arthritis panel.  Sent in prescription for Voltaren 75 mg to be taken as directed.  Follow-up with me in 3 weeks for recheck.  Follow-Up Instructions: Return in about 3 weeks (around 03/21/2022) for with Nolyn Eilert recheck si joint and left lat hip pain and review labs.   Orders:  Orders Placed This Encounter  Procedures   CBC   Antinuclear Antib (ANA)   Rheumatoid Factor   Uric acid   Sed Rate (ESR)   C-reactive protein   Meds ordered this encounter  Medications   diclofenac (VOLTAREN) 75 MG EC tablet    Sig: Take 1 tablet (75 mg total) by mouth every 12 (twelve) hours as needed (must take with food.). Must take with food    Dispense:  40 tablet    Refill:  0      Procedures: No procedures performed   Clinical Data: No additional findings.   Subjective: Chief Complaint  Patient presents with   Lower Back - Pain    HPI 21 year old white female comes in today with complaints of low back pain.  Pain started about 2 months ago.  No injury.  Aggravated with walking and prolonged standing.  She works as a Quarry manager.  Low back pain more localized to the bilateral SI joints and left lateral hip.  States that her PCP told her that she had sciatic nerve pain. Review of Systems No current cardiopulmonary GI/GU issues  Objective: Vital Signs: LMP 02/09/2022 (Exact Date) Comment: end date  Physical Exam HENT:     Head: Normocephalic and atraumatic.     Nose: Nose normal.  Pulmonary:      Effort: No respiratory distress.  Musculoskeletal:     Comments: Gait is normal.  No lumbar paraspinal tenderness/spasm.  She has moderate to marked tenderness over the bilateral SI joints.  Moderate tenderness over the left hip greater trochanter bursa and this extends midway down the IT band.  Negative logroll bilateral hips.  Negative straight leg raise.  No focal motor deficits.  Neurological:     Mental Status: She is alert and oriented to person, place, and time.     Ortho Exam  Specialty Comments:  No specialty comments available.  Imaging: No results found.   PMFS History: Patient Active Problem List   Diagnosis Date Noted   Head trauma 04/30/2021   Chronic left-sided low back pain without sciatica 06/14/2019   Major depressive disorder, recurrent episode, moderate ([place])    Suicide attempt by drug ingestion ([place]) 11/15/2014   Parent-child conflict 27/78/2423   MDD (major depressive disorder), recurrent episode ([place]) 11/14/2014   GAD (generalized anxiety disorder) 09/13/2014   Depression 09/13/2014   Past Medical History:  Diagnosis Date   Anxiety    Depression     Family History  Problem Relation Age of Onset   Hypertension Mother  Hyperthyroidism Mother    Diabetes Paternal Grandmother    COPD Paternal Grandmother    Hyperlipidemia Paternal Grandmother    Hypertension Paternal Grandmother    Anxiety disorder Paternal Grandmother     History reviewed. No pertinent surgical history. Social History   Occupational History   Not on file  Tobacco Use   Smoking status: Never    Passive exposure: Yes   Smokeless tobacco: Never  Vaping Use   Vaping Use: Never used  Substance and Sexual Activity   Alcohol use: No   Drug use: No   Sexual activity: Never

## 2022-02-28 NOTE — Telephone Encounter (Signed)
Received email from [employer] stating diclofenac is not a preferred drug and the prescription will either need to be changed or we would need to try and obtain prior authorization. Per Zonia Kief, PA-C, change to Meloxicam 15mg  1 po qd #30 with no refills. Meloxicam sent to pharmacy. I called pharmacy and had then delete rx for Diclofenac 75mg .

## 2022-03-02 LAB — ANTI-NUCLEAR AB-TITER (ANA TITER): ANA Titer 1: 1:80 {titer} — ABNORMAL HIGH

## 2022-03-02 LAB — CBC
HCT: 40.6 % (ref 35.0–45.0)
Hemoglobin: 13.2 g/dL (ref 11.7–15.5)
MCH: 29.1 pg (ref 27.0–33.0)
MCHC: 32.5 g/dL (ref 32.0–36.0)
MCV: 89.6 fL (ref 80.0–100.0)
MPV: 12.7 fL — ABNORMAL HIGH (ref 7.5–12.5)
Platelets: 231 10*3/uL (ref 140–400)
RBC: 4.53 10*6/uL (ref 3.80–5.10)
RDW: 12.9 % (ref 11.0–15.0)
WBC: 8.7 10*3/uL (ref 3.8–10.8)

## 2022-03-02 LAB — ANA: Anti Nuclear Antibody (ANA): POSITIVE — AB

## 2022-03-02 LAB — SEDIMENTATION RATE: Sed Rate: 14 mm/h (ref 0–20)

## 2022-03-02 LAB — C-REACTIVE PROTEIN: CRP: 2.1 mg/L (ref <8.0)

## 2022-03-02 LAB — RHEUMATOID FACTOR: Rheumatoid fact SerPl-aCnc: <14 IU/mL (ref <14)

## 2022-03-02 LAB — URIC ACID: Uric Acid, Serum: 4.2 mg/dL (ref 2.5–7.0)

## 2022-03-13 IMAGING — CT CT HEAD W/O CM
3 series · 16 of 47 positions shown, 19 images · non-contrast
Comparison: None.

CLINICAL DATA: Struck head on metal item today, subsequent
dizziness and vomiting.

EXAM:
CT HEAD WITHOUT CONTRAST
TECHNIQUE: Contiguous axial images were obtained from the base of the skull
through the vertex without intravenous contrast.

[Series 2: head w o · axial · 0.41mm/px · z∈[+22,+147]mm · 10 of 31 slices shown, 13 images]
[im 3/31  brain]
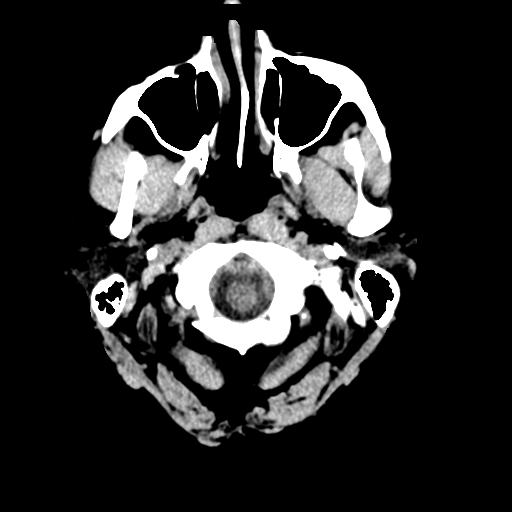
[im 3/31  bone]
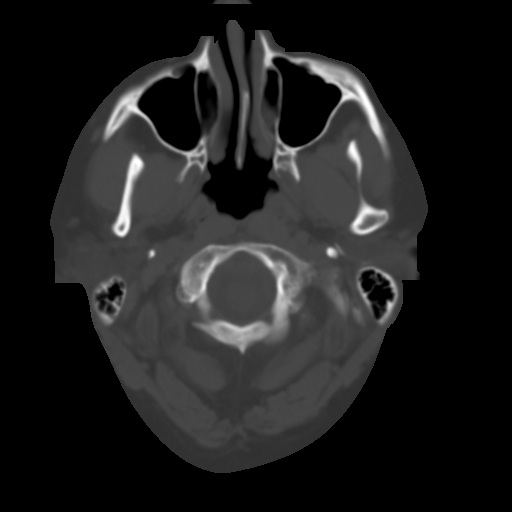
[im 6/31  brain]
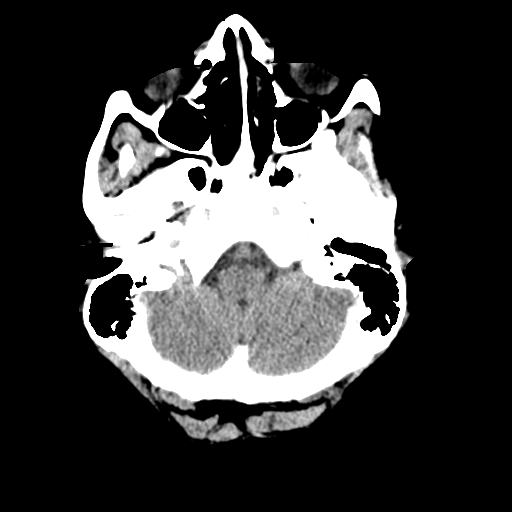
[im 9/31  brain]
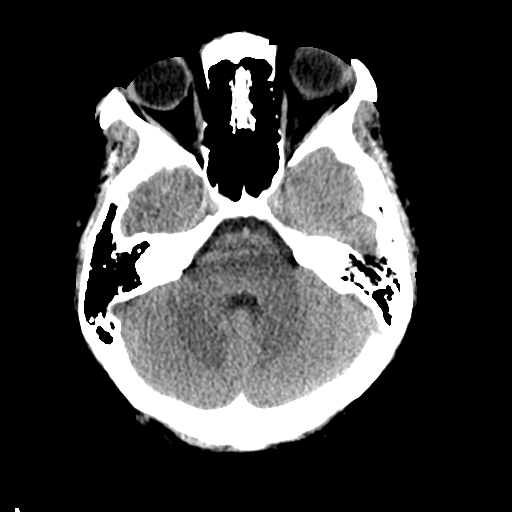
[im 11/31  brain]
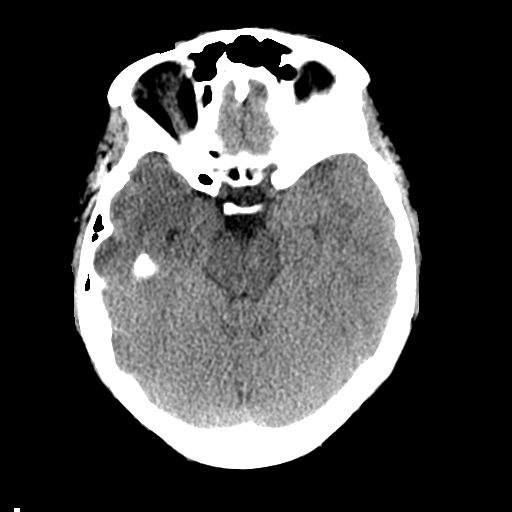
[im 14/31  brain]
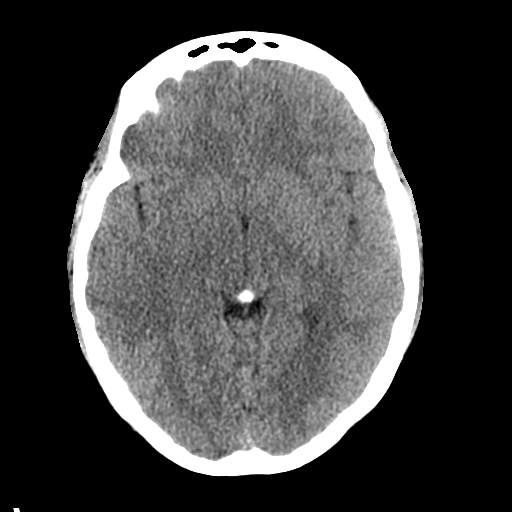
[im 14/31  bone]
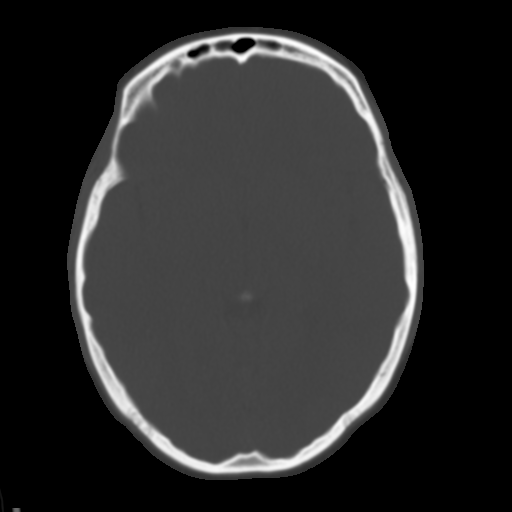
[im 17/31  brain]
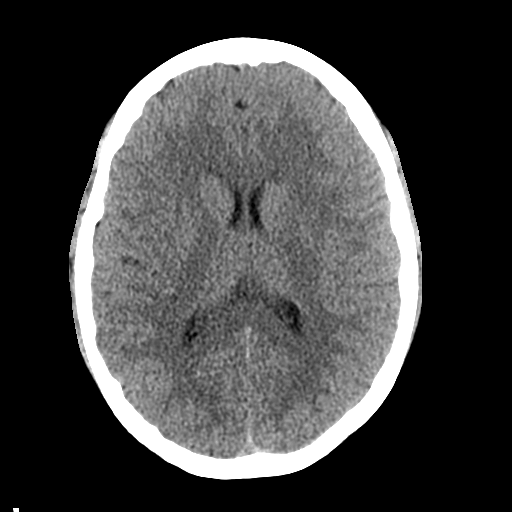
[im 20/31  brain]
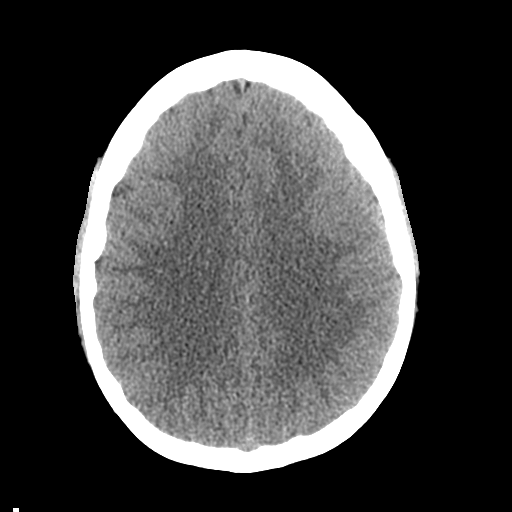
[im 23/31  brain]
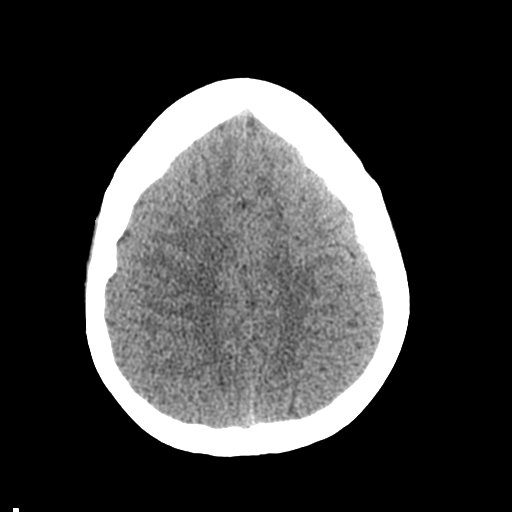
[im 25/31  brain]
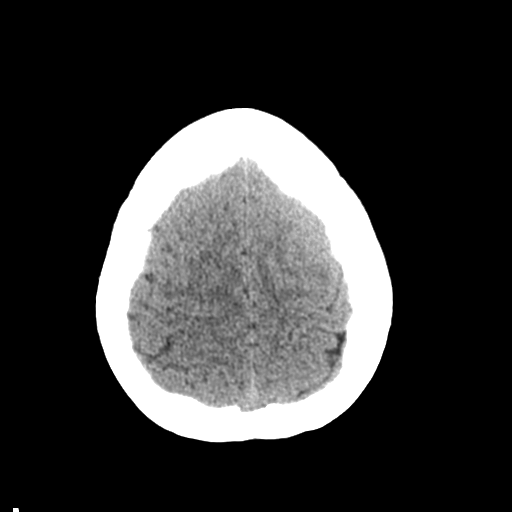
[im 25/31  bone]
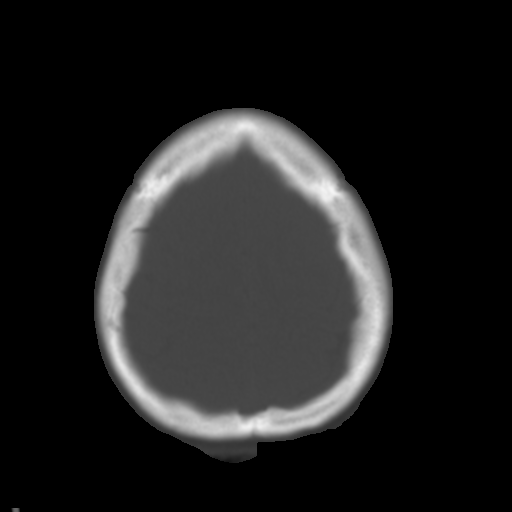
[im 28/31  brain]
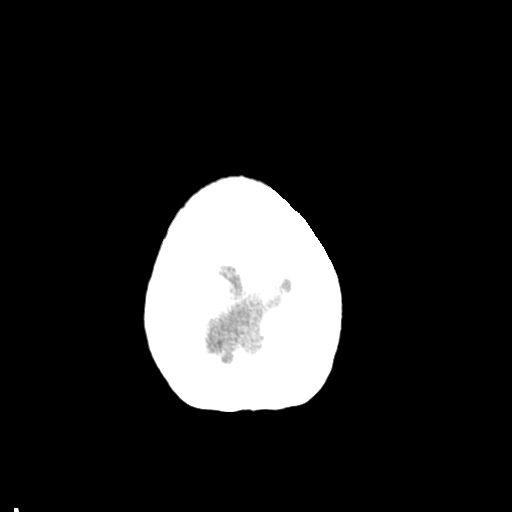

[Series 4: coronal soft · coronal · 0.32mm/px · 3 of 67 slices shown]
[im 23/67  brain]
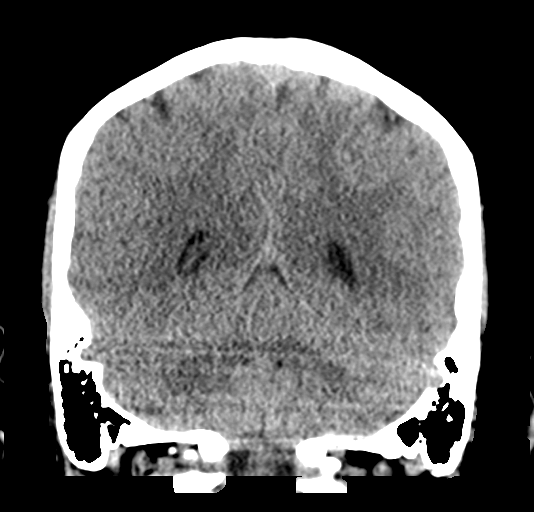
[im 30/67  brain]
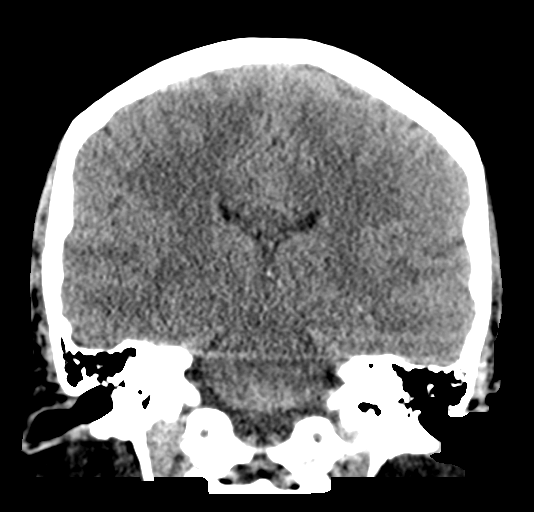
[im 37/67  brain]
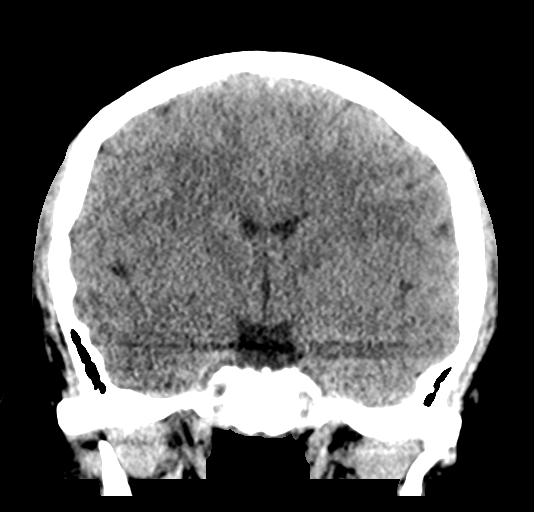

[Series 5: sagittal soft · sagittal · 0.32mm/px · 3 of 58 slices shown]
[im 20/58  brain]
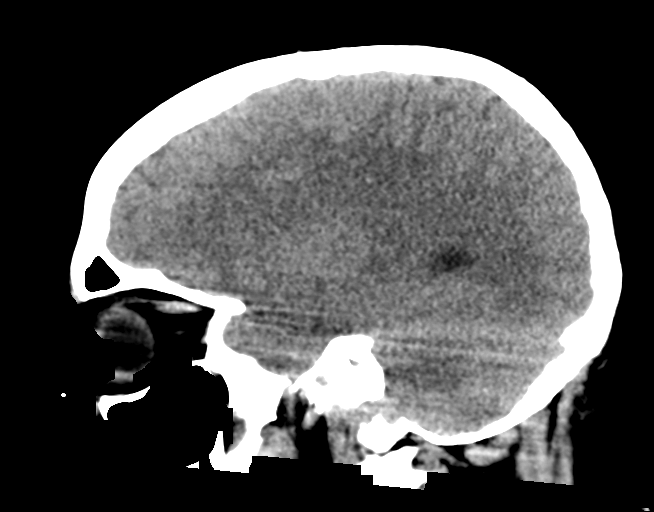
[im 29/58  brain]
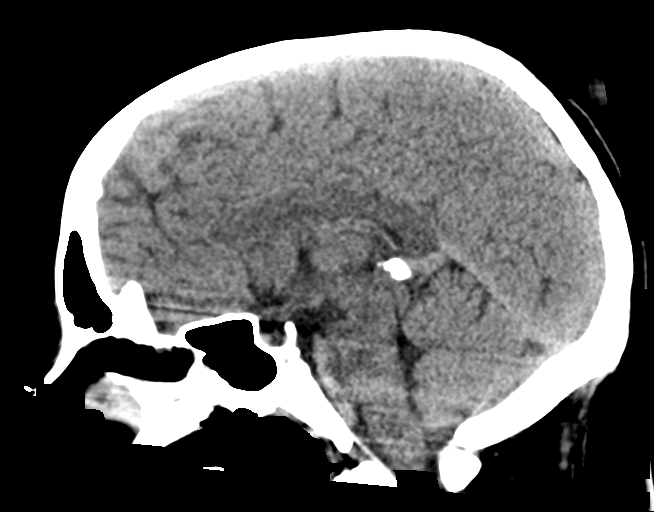
[im 39/58  brain]
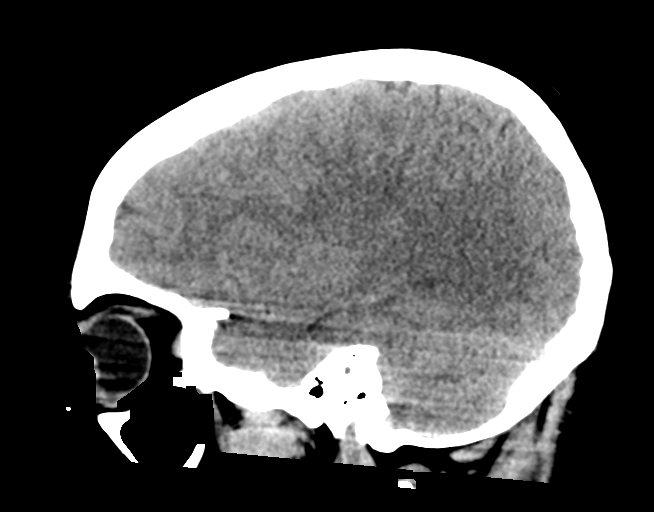

[16 of 47 positions shown; findings below may reference images not displayed]

FINDINGS: Brain: The brainstem, cerebellum, cerebral peduncles, thalami, basal
ganglia, basilar cisterns, and ventricular system appear within
normal limits. No intracranial hemorrhage, mass lesion, or acute
CVA.

Vascular: Unremarkable

Skull: Unremarkable

Sinuses/Orbits: Unremarkable

Other: No supplemental non-categorized findings.
IMPRESSION: 1.  No significant abnormality identified.

## 2022-03-20 ENCOUNTER — Ambulatory Visit (INDEPENDENT_AMBULATORY_CARE_PROVIDER_SITE_OTHER): Payer: Medicaid Other | Admitting: Surgery

## 2022-03-20 ENCOUNTER — Encounter: Payer: Self-pay | Admitting: Surgery

## 2022-03-20 VITALS — BP 120/78 | HR 74 | Ht 62.0 in | Wt 230.0 lb

## 2022-03-20 DIAGNOSIS — M7062 Trochanteric bursitis, left hip: Secondary | ICD-10-CM

## 2022-03-20 DIAGNOSIS — R768 Other specified abnormal immunological findings in serum: Secondary | ICD-10-CM

## 2022-03-20 DIAGNOSIS — M461 Sacroiliitis, not elsewhere classified: Secondary | ICD-10-CM | POA: Diagnosis not present

## 2022-03-20 NOTE — Progress Notes (Signed)
   Office Visit Note   Patient: Lindsay Valdez           Date of Birth: 07-Dec-2000           MRN: [id number] Visit Date: 03/20/2022              Requested by: Raliegh Ip, DO [address] [place],  [place] [address] PCP: Raliegh Ip, DO   Assessment & Plan: Visit Diagnoses:  1. Greater trochanteric bursitis of left hip   2. Bilateral SI (sacroiliac) joint inflammation (HCC)   3. Elevated antinuclear antibody (ANA) level     Plan: With patient's elevated ANA titer I will schedule appointment with Dr. Zenovia Jordan rheumatologist get her input.  Patient will return back to see me in 2 months so we can see what Dr. Nickola Major  recommendations are.  All questions answered.  She will continue doing IT band stretching exercises and using Mobic as needed.  She declined left hip greater trochanter bursa Marcaine/Depo-Medrol injection.  Follow-Up Instructions: Return in about 2 months (around 05/20/2022) for with [facility] recheck after rheumatology.   Orders:  No orders of the defined types were placed in this encounter.  No orders of the defined types were placed in this encounter.     Procedures: No procedures performed   Clinical Data: No additional findings.   Subjective: Chief Complaint  Patient presents with   Left Hip - Follow-up    HPI 21 year old white female returns for recheck of her bilateral SI joint pain, left lateral hip pain and review arthritis panel ordered last office visit.  Labs showed positive ANA titer with nuclear speckled ANA pattern.  She has been doing the IT band stretching exercises and these have helped.   Objective: Vital Signs: BP 120/78   Pulse 74   Ht 5\' 2"  (1.575 m)   Wt 230 lb (104.3 kg)   BMI 42.07 kg/m   Physical Exam Pleasant female alert and oriented in no acute distress.  She has mild to moderate tenderness over the left hip greater trochanter bursa. Ortho Exam  Specialty Comments:  No specialty comments  available.  Imaging: No results found.   PMFS History: Patient Active Problem List   Diagnosis Date Noted   Head trauma 04/30/2021   Chronic left-sided low back pain without sciatica 06/14/2019   Major depressive disorder, recurrent episode, moderate (HCC)    Suicide attempt by drug ingestion (HCC) 11/15/2014   Parent-child conflict 11/15/2014   MDD (major depressive disorder), recurrent episode (HCC) 11/14/2014   GAD (generalized anxiety disorder) 09/13/2014   Depression 09/13/2014   Past Medical History:  Diagnosis Date   Anxiety    Depression     Family History  Problem Relation Age of Onset   Hypertension Mother    Hyperthyroidism Mother    Diabetes Paternal Grandmother    COPD Paternal Grandmother    Hyperlipidemia Paternal Grandmother    Hypertension Paternal Grandmother    Anxiety disorder Paternal Grandmother     No past surgical history on file. Social History   Occupational History   Not on file  Tobacco Use   Smoking status: Never    Passive exposure: Yes   Smokeless tobacco: Never  Vaping Use   Vaping Use: Never used  Substance and Sexual Activity   Alcohol use: No   Drug use: No   Sexual activity: Never

## 2022-05-22 ENCOUNTER — Ambulatory Visit: Payer: Medicaid Other | Admitting: Surgery

## 2022-08-15 ENCOUNTER — Ambulatory Visit (INDEPENDENT_AMBULATORY_CARE_PROVIDER_SITE_OTHER): Payer: Medicaid Other | Admitting: Family Medicine

## 2022-08-15 ENCOUNTER — Encounter: Payer: Self-pay | Admitting: Family Medicine

## 2022-08-15 VITALS — BP 106/66 | HR 96 | Temp 98.6°F | Ht 62.0 in | Wt 226.0 lb

## 2022-08-15 DIAGNOSIS — R768 Other specified abnormal immunological findings in serum: Secondary | ICD-10-CM

## 2022-08-15 DIAGNOSIS — R234 Changes in skin texture: Secondary | ICD-10-CM

## 2022-08-15 DIAGNOSIS — M25552 Pain in left hip: Secondary | ICD-10-CM | POA: Diagnosis not present

## 2022-08-15 NOTE — Progress Notes (Signed)
   Subjective: CC: Hip pain PCP: [name], DO [id number] Horsford is a 22 y.o. female presenting to clinic today for:  1.  Hip pain Patient reports hip pain has gotten slightly better since starting to do physical therapy.  She was treated with a course of prednisone by orthopedics and had autoimmune labs drawn.  She notes that the labs really were not reviewed with her.  She is here today asking that I reviewed the labs with her.  Upon further discussion she apparently was referred to rheumatology for further evaluation and management but she notes that she was not inside the network and therefore her referral was denied.  When she contacted the orthopedist office to try and get this rearranged she was told that it could not be resent to a different office?   ROS: Per HPI  No Known Allergies Past Medical History:  Diagnosis Date   Anxiety    Depression    No current outpatient medications on file. Social History   Socioeconomic History   Marital status: Single    Spouse name: Not on file   Number of children: Not on file   Years of education: Not on file   Highest education level: Not on file  Occupational History   Not on file  Tobacco Use   Smoking status: Never    Passive exposure: Yes   Smokeless tobacco: Never  Vaping Use   Vaping Use: Never used  Substance and Sexual Activity   Alcohol use: No   Drug use: No   Sexual activity: Never  Other Topics Concern   Not on file  Social History Narrative   Not on file   Social Determinants of Health   Financial Resource Strain: Not on file  Food Insecurity: Not on file  Transportation Needs: Not on file  Physical Activity: Not on file  Stress: Not on file  Social Connections: Not on file  Intimate Partner Violence: Not on file   Family History  Problem Relation Age of Onset   Hypertension Mother    Hyperthyroidism Mother    Diabetes Paternal Grandmother    COPD Paternal Grandmother    Hyperlipidemia  Paternal Grandmother    Hypertension Paternal Grandmother    Anxiety disorder Paternal Grandmother     Objective: Office vital signs reviewed. BP 106/66   Pulse 96   Temp 98.6 F (37 C)   Ht 5\' 2"  (1.575 m)   Wt 226 lb (102.5 kg)   LMP 08/04/2022   SpO2 96%   BMI 41.34 kg/m   Physical Examination:  General: Awake, alert, well nourished, No acute distress HEENT: sclera white, MMM  Assessment/ Plan: 22 y.o. female   Positive ANA (antinuclear antibody) - Plan: Ambulatory referral to Rheumatology  Pain of left hip - Plan: Ambulatory referral to Rheumatology  Breast skin changes - Plan: Ambulatory referral to Rheumatology  We reviewed her lab results from the orthopedist office.  Her positive ANA with speckling is very suggestive of autoimmune disease.  I am most suspicious of possible SLE given reports of breast skin changes that really could not be treated previously by dermatology.  I have placed referral to rheumatology for further evaluation and management of this.  No orders of the defined types were placed in this encounter.  No orders of the defined types were placed in this encounter.    [name], DO [place] [phone]

## 2022-09-29 ENCOUNTER — Ambulatory Visit (INDEPENDENT_AMBULATORY_CARE_PROVIDER_SITE_OTHER): Payer: Medicaid Other

## 2022-09-29 DIAGNOSIS — Z111 Encounter for screening for respiratory tuberculosis: Secondary | ICD-10-CM | POA: Diagnosis not present

## 2022-09-29 NOTE — Progress Notes (Signed)
TB skin test applied to left forearm, patient tolerated well.  Appointment scheduled on Wednesday, 10/01/22, for patient to return to have TB skin test read.

## 2022-10-01 ENCOUNTER — Ambulatory Visit: Payer: Medicaid Other

## 2022-10-01 DIAGNOSIS — Z111 Encounter for screening for respiratory tuberculosis: Secondary | ICD-10-CM

## 2022-10-01 LAB — TB SKIN TEST
Induration: 0 mm
TB Skin Test: NEGATIVE

## 2022-10-01 NOTE — Progress Notes (Signed)
TB skin test negative, patient informed and letter printed for patient.

## 2023-01-23 NOTE — Progress Notes (Deleted)
Office Visit Note  Patient: Lindsay Valdez             Date of Birth: Sep 20, 2000           MRN: [id number]             PCP: Raliegh Ip, DO Referring: Raliegh Ip, DO Visit Date: 02/06/2023 Occupation: @GUAROCC @  Subjective:  No chief complaint on file.   History of Present Illness: Lindsay Valdez is a 22 y.o. female ***     Activities of Daily Living:  Patient reports morning stiffness for *** {minute/hour:19697}.   Patient {ACTIONS;DENIES/REPORTS:21021675::"Denies"} nocturnal pain.  Difficulty dressing/grooming: {ACTIONS;DENIES/REPORTS:21021675::"Denies"} Difficulty climbing stairs: {ACTIONS;DENIES/REPORTS:21021675::"Denies"} Difficulty getting out of chair: {ACTIONS;DENIES/REPORTS:21021675::"Denies"} Difficulty using hands for taps, buttons, cutlery, and/or writing: {ACTIONS;DENIES/REPORTS:21021675::"Denies"}  No Rheumatology ROS completed.   PMFS History:  Patient Active Problem List   Diagnosis Date Noted   Head trauma 04/30/2021   Chronic left-sided low back pain without sciatica 06/14/2019   Major depressive disorder, recurrent episode, moderate (HCC)    Suicide attempt by drug ingestion (HCC) 11/15/2014   Parent-child conflict 11/15/2014   MDD (major depressive disorder), recurrent episode (HCC) 11/14/2014   GAD (generalized anxiety disorder) 09/13/2014   Depression 09/13/2014    Past Medical History:  Diagnosis Date   Anxiety    Depression     Family History  Problem Relation Age of Onset   Hypertension Mother    Hyperthyroidism Mother    Diabetes Paternal Grandmother    COPD Paternal Grandmother    Hyperlipidemia Paternal Grandmother    Hypertension Paternal Grandmother    Anxiety disorder Paternal Grandmother    No past surgical history on file. Social History   Social History Narrative   Not on file   Immunization History  Administered Date(s) Administered   Influenza,inj,Quad PF,6+ Mos 05/03/2013, 05/16/2014, 05/04/2015,  07/17/2016, 06/23/2017   Moderna Sars-Covid-2 Vaccination 02/28/2020, 03/27/2020   PPD Test 09/29/2022     Objective: Vital Signs: There were no vitals taken for this visit.   Physical Exam   Musculoskeletal Exam: ***  CDAI Exam: CDAI Score: -- Patient Global: --; Provider Global: -- Swollen: --; Tender: -- Joint Exam 02/06/2023   No joint exam has been documented for this visit   There is currently no information documented on the homunculus. Go to the Rheumatology activity and complete the homunculus joint exam.  Investigation: No additional findings.  Imaging: No results found.  Recent Labs: Lab Results  Component Value Date   WBC 8.7 02/28/2022   HGB 13.2 02/28/2022   PLT 231 02/28/2022   NA 140 07/30/2018   K 4.0 07/30/2018   CL 102 07/30/2018   CO2 19 (L) 07/30/2018   GLUCOSE 89 07/30/2018   BUN 11 07/30/2018   CREATININE 0.72 07/30/2018   BILITOT 0.3 07/30/2018   ALKPHOS 78 07/30/2018   AST 16 07/30/2018   ALT 15 07/30/2018   PROT 7.0 07/30/2018   ALBUMIN 4.9 07/30/2018   CALCIUM 10.0 07/30/2018   GFRAA CANCELED 07/30/2018    Speciality Comments: No specialty comments available.  Procedures:  No procedures performed Allergies: Patient has no known allergies.   Assessment / Plan:     Visit Diagnoses: Positive ANA (antinuclear antibody) - 02/28/22: ANA 1:80NS, RF<14, uric acid 4.2, ESR 14, CRP 2.1  Pain of left hip  Breast skin changes  Chronic left-sided low back pain without sciatica  GAD (generalized anxiety disorder)  Major depressive disorder, recurrent episode, moderate (HCC)  Orders: No orders of the  defined types were placed in this encounter.  No orders of the defined types were placed in this encounter.   Face-to-face time spent with patient was *** minutes. Greater than 50% of time was spent in counseling and coordination of care.  Follow-Up Instructions: No follow-ups on file.   Gearldine Bienenstock, PA-C  Note - This record  has been created using Dragon software.  Chart creation errors have been sought, but may not always  have been located. Such creation errors do not reflect on  the standard of medical care.

## 2023-02-06 ENCOUNTER — Encounter: Payer: Medicaid Other | Admitting: Rheumatology

## 2023-02-06 DIAGNOSIS — R768 Other specified abnormal immunological findings in serum: Secondary | ICD-10-CM

## 2023-02-06 DIAGNOSIS — M25552 Pain in left hip: Secondary | ICD-10-CM

## 2023-02-06 DIAGNOSIS — F411 Generalized anxiety disorder: Secondary | ICD-10-CM

## 2023-02-06 DIAGNOSIS — F331 Major depressive disorder, recurrent, moderate: Secondary | ICD-10-CM

## 2023-02-06 DIAGNOSIS — G8929 Other chronic pain: Secondary | ICD-10-CM

## 2023-02-06 DIAGNOSIS — R234 Changes in skin texture: Secondary | ICD-10-CM

## 2023-03-06 ENCOUNTER — Ambulatory Visit: Payer: Medicaid Other | Admitting: Rheumatology

## 2024-08-24 ENCOUNTER — Ambulatory Visit: Admitting: Family Medicine

## 2024-08-25 ENCOUNTER — Ambulatory Visit: Admitting: Physician Assistant

## 2024-08-25 ENCOUNTER — Encounter: Payer: Self-pay | Admitting: Physician Assistant

## 2024-08-25 VITALS — BP 123/81 | HR 122 | Temp 100.0°F | Ht 62.0 in | Wt 230.0 lb

## 2024-08-25 DIAGNOSIS — K529 Noninfective gastroenteritis and colitis, unspecified: Secondary | ICD-10-CM | POA: Insufficient documentation

## 2024-08-25 MED ORDER — ONDANSETRON 4 MG PO TBDP: 4.0000 mg | ORAL_TABLET | Freq: Three times a day (TID) | ORAL | 0 refills | Status: AC | PRN

## 2024-08-25 NOTE — Progress Notes (Signed)
 "  Acute Office Visit  Subjective:     Patient ID: Lindsay Valdez, female    DOB: 2000/07/30, 24 y.o.   MRN: [id number]   Discussed the use of AI scribe software for clinical note transcription with the patient, who gave verbal consent to proceed.  History of Present Illness Lindsay Valdez is a 24 year old female who presents with nausea, vomiting, and diarrhea.  She began experiencing nausea, vomiting, and diarrhea yesterday morning after finishing her night shift at 6 AM. Vomiting started around 8 AM, accompanied by diarrhea. She felt fine before eating, but everything consumed was vomited shortly after.  She attempted to manage her symptoms with Zofran  and Tylenol , but both were vomited. She has been unable to keep down water. She has not consumed any food or drink in the last 24 hours due to persistent nausea and vomiting.  She describes frequent vomiting yesterday, occurring approximately every hour, but only once today. The vomiting has mostly consisted of stomach acid or resulted in dry heaving. Diarrhea occurred three to four times yesterday but has not been present today.  She reports a low-grade fever, fluctuating between 98 and 99 degrees yesterday, and reaching 100 degrees this morning. She also experiences abdominal and back pain, which she attributes to the vomiting.  No blood has been observed in her vomit or stool. She has not taken any other medications besides Zofran  and Tylenol . She attempted to sip water today but found it exacerbated her nausea.   Review of Systems  Constitutional:  Positive for fatigue and fever. Negative for appetite change.  Respiratory:  Negative for shortness of breath.   Cardiovascular:  Negative for chest pain.  Gastrointestinal:  Positive for diarrhea, nausea and vomiting. Negative for abdominal pain.  Genitourinary:  Negative for dysuria.         Objective:     BP 123/81   Pulse (!) 122   Temp 100 F (37.8 C)   Ht 5' 2 (1.575  m)   Wt 230 lb (104.3 kg)   SpO2 97%   BMI 42.07 kg/m   Physical Exam Constitutional:      General: She is not in acute distress.    Appearance: Normal appearance. She is obese. She is ill-appearing.  HENT:     Head: Normocephalic and atraumatic.     Mouth/Throat:     Mouth: Mucous membranes are moist.     Pharynx: Oropharynx is clear.  Eyes:     Extraocular Movements: Extraocular movements intact.     Conjunctiva/sclera: Conjunctivae normal.  Cardiovascular:     Rate and Rhythm: Normal rate and regular rhythm.     Heart sounds: Normal heart sounds. No murmur heard. Pulmonary:     Effort: Pulmonary effort is normal.     Breath sounds: Normal breath sounds.  Abdominal:     General: Abdomen is flat. Bowel sounds are normal. There is no distension.     Palpations: Abdomen is soft.     Tenderness: There is no abdominal tenderness. There is no guarding.  Skin:    General: Skin is warm and dry.  Neurological:     General: No focal deficit present.     Mental Status: She is alert and oriented to person, place, and time.  Psychiatric:        Mood and Affect: Mood normal.        Behavior: Behavior normal.     No results found for any visits on 08/25/24.  Assessment & Plan:  AGE (acute gastroenteritis) Assessment & Plan: Acute onset of nausea, vomiting, and diarrhea since yesterday morning, likely viral in etiology. Vomiting is more frequent than diarrhea, with no blood in vomit or stool. Mild fever present. Benign physical exam. Symptoms expected to self-resolve in 3-5 days. Risk of dehydration due to inability to retain fluids. - Encouraged small sips of fluids as tolerated to maintain hydration. - Prescribed Zofran  dissolvable tablet for nausea. - Ordered lab work to check for signs of severe infection or electrolyte disturbances. - Advised use of over-the-counter medications like Pepto-Bismol or Imodium if diarrhea increases. - Instructed to advance diet as tolerated,  starting with broths and bland foods. - Advised follow-up with primary care doctor if symptoms persist beyond one week.  Orders: -     CBC -     CMP14+EGFR -     Lipase -     Ondansetron ; Take 1 tablet (4 mg total) by mouth every 8 (eight) hours as needed for nausea or vomiting.  Dispense: 20 tablet; Refill: 0    Return if symptoms worsen or fail to improve.  Rmani Kapusta, PA-C  "

## 2024-08-25 NOTE — Assessment & Plan Note (Signed)
 Acute onset of nausea, vomiting, and diarrhea since yesterday morning, likely viral in etiology. Vomiting is more frequent than diarrhea, with no blood in vomit or stool. Mild fever present. Benign physical exam. Symptoms expected to self-resolve in 3-5 days. Risk of dehydration due to inability to retain fluids. - Encouraged small sips of fluids as tolerated to maintain hydration. - Prescribed Zofran  dissolvable tablet for nausea. - Ordered lab work to check for signs of severe infection or electrolyte disturbances. - Advised use of over-the-counter medications like Pepto-Bismol or Imodium if diarrhea increases. - Instructed to advance diet as tolerated, starting with broths and bland foods. - Advised follow-up with primary care doctor if symptoms persist beyond one week.

## 2024-08-26 ENCOUNTER — Ambulatory Visit: Payer: Self-pay | Admitting: Physician Assistant

## 2024-08-26 LAB — CMP14+EGFR
ALT: 24 [IU]/L (ref 0–32)
AST: 16 [IU]/L (ref 0–40)
Albumin: 4.6 g/dL (ref 4.0–5.0)
Alkaline Phosphatase: 71 [IU]/L (ref 41–116)
BUN/Creatinine Ratio: 19 (ref 9–23)
BUN: 14 mg/dL (ref 6–20)
Bilirubin Total: 0.4 mg/dL (ref 0.0–1.2)
CO2: 22 mmol/L (ref 20–29)
Calcium: 9.4 mg/dL (ref 8.7–10.2)
Chloride: 104 mmol/L (ref 96–106)
Creatinine, Ser: 0.73 mg/dL (ref 0.57–1.00)
Globulin, Total: 2.5 g/dL (ref 1.5–4.5)
Glucose: 104 mg/dL — ABNORMAL HIGH (ref 70–99)
Potassium: 3.6 mmol/L (ref 3.5–5.2)
Sodium: 142 mmol/L (ref 134–144)
Total Protein: 7.1 g/dL (ref 6.0–8.5)
eGFR: 118 mL/min/{1.73_m2}

## 2024-08-26 LAB — CBC
Hematocrit: 39.6 % (ref 34.0–46.6)
Hemoglobin: 12.9 g/dL (ref 11.1–15.9)
MCH: 28.9 pg (ref 26.6–33.0)
MCHC: 32.6 g/dL (ref 31.5–35.7)
MCV: 89 fL (ref 79–97)
Platelets: 231 10*3/uL (ref 150–450)
RBC: 4.46 x10E6/uL (ref 3.77–5.28)
RDW: 13.2 % (ref 11.7–15.4)
WBC: 10.5 10*3/uL (ref 3.4–10.8)

## 2024-08-26 LAB — LIPASE: Lipase: 37 U/L (ref 14–72)
